# Patient Record
Sex: Female | Born: 2000
Health system: Southern US, Community
[De-identification: ages and names within clinical notes are randomized; demographics above are authoritative.]

## PROBLEM LIST (undated history)

## (undated) DIAGNOSIS — Z9189 Other specified personal risk factors, not elsewhere classified: Secondary | ICD-10-CM

## (undated) DIAGNOSIS — R519 Headache, unspecified: Secondary | ICD-10-CM

## (undated) HISTORY — DX: Other specified personal risk factors, not elsewhere classified: Z91.89

## (undated) HISTORY — PX: WISDOM TOOTH EXTRACTION: SHX21

## (undated) HISTORY — DX: Headache, unspecified: R51.9

---
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
## 2019-07-24 DIAGNOSIS — L988 Other specified disorders of the skin and subcutaneous tissue: Secondary | ICD-10-CM | POA: Diagnosis not present

## 2019-07-24 DIAGNOSIS — S61012A Laceration without foreign body of left thumb without damage to nail, initial encounter: Secondary | ICD-10-CM | POA: Diagnosis not present

## 2019-11-19 DIAGNOSIS — Z20828 Contact with and (suspected) exposure to other viral communicable diseases: Secondary | ICD-10-CM | POA: Diagnosis not present

## 2020-02-18 NOTE — Progress Notes (Addendum)
[facility] [address], [address] [place], [place] [address] [phone]  Date:  02/20/2020   Name:  Maria Acosta   DOB:  10/14/2000   MRN:  [id number]  PCP:  Pearline Cables, MD    Chief Complaint: New Patient (Initial Visit) (Elevated pulse rate, near syncope, possibly /stress related)   History of Present Illness:  Maria Acosta is a 19 y.o. 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  She passed out while walking to class at school one day- was attended by EMTs, seen at ER and sent home .  She passed out also a couple of years ago after observing an operation as part of her pre-vet training She has not had true syncope since that time.  She has had a few pre-syncopal episodes with stress; social stress or a test can do this.  Can also occur when at rest  She notes that during high school she would sometimes do a "boot camp" type workout, this could be associate with presyncopal symptoms but never syncope She is still active in soccer and has not experienced presyncopal symptoms while exercising recently  Pt notes that a relative had a similar issue 2 years ago- they were not sure if this was just dehydration Her PGM had a pacemaker placed   Pt has not seen cardiology as of yet  She is on OCP She will take magnesium supplement some of the time   Flu vaccine today  She has not yet had her COVID-19 vaccine.  I encouraged her to do this as soon as possible  Patient denies any current chance of pregnancy.  She declines STI screening today Other immunizations are up-to-date  There are no problems to display for this  patient.   Past Medical History:  Diagnosis Date  . Frequent headaches   . History of fainting spells of unknown cause     History reviewed. No pertinent surgical history.  Social History   Tobacco Use  . Smoking status: Never Smoker  . Smokeless tobacco: Never Used  Substance Use Topics  . Alcohol use: Not Currently  . Drug use: Never    History reviewed. No pertinent family history.  Not on File  Medication list has been reviewed and updated.  Current Outpatient Medications on File Prior to Visit  Medication Sig Dispense Refill  . Magnesium 250 MG TABS Take 250 mg by mouth daily.    . SRONYX 0.1-20 MG-MCG tablet Take 1 tablet by mouth daily.     No current facility-administered medications on file prior to visit.    Review of Systems:  As per HPI- otherwise negative.   Physical Examination: Vitals:   02/20/20 0939  BP: 126/78  Pulse: (!) 106  Resp: 18  SpO2: 98%   Vitals:   02/20/20 0939  Weight: 191 lb (86.6 kg)  Height: 5' 6.5" (1.689 m)   Body mass index is 30.37 kg/m. Ideal Body Weight: Weight in (lb) to have BMI = 25: 156.9  GEN: no acute distress.  Overweight, otherwise looks well HEENT: Atraumatic, Normocephalic.   Bilateral TM wnl, oropharynx normal.  PEERL,EOMI.   Ears and  Nose: No external deformity. CV: RRR, No M/G/R. No JVD. No thrill. No extra heart sounds. PULM: CTA B, no wheezes, crackles, rhonchi. No retractions. No resp. distress. No accessory muscle use. ABD: S, NT, ND, +BS. No rebound. No HSM. EXTR: No c/c/e PSYCH: Normally interactive. Conversant.   EKG: rate 88, normal EKG for age Assessment and Plan: Pre-syncope - Plan: CBC, TSH, EKG 12-Lead, Ambulatory referral to Cardiology  Screening, deficiency anemia, iron - Plan: CBC  Screening for diabetes mellitus - Plan: Comprehensive metabolic panel, Hemoglobin A1c  Screening for hyperlipidemia - Plan: Lipid panel  Needs flu shot - Plan: Flu Vaccine QUAD 6+ mos PF IM  (Fluarix Quad PF)   Pt here today to establish care Flu shot given Strongly encouraged COVID-19 vaccination as soon as possible Routine labs are pending as above Main issue for this patient is presyncopal and syncopal episode which have occurred for about 6 years She has not yet seen cardiology Referral made to cardiology, hopefully this can be accomplished while she is home for holiday break.  I can certainly go ahead and order an echo and Zio patch if her appointment will be delayed-patient will keep me posted She will let me know if any change or worsening of her symptoms in the meantime   This visit occurred during the SARS-CoV-2 public health emergency.  Safety protocols were in place, including screening questions prior to the visit, additional usage of staff PPE, and extensive cleaning of exam room while observing appropriate contact time as indicated for disinfecting solutions.    Signed Abbe Amsterdam, MD  Results for orders placed or performed in visit on 02/20/20  CBC  Result Value Ref Range   WBC 7.4 4.5 - 10.5 K/uL   RBC 5.02 3.87 - 5.11 Mil/uL   Platelets 267.0 150.0 - 400.0 K/uL   Hemoglobin 14.7 12.0 - 15.0 g/dL   HCT 96.7 59.1 - 63.8 %   MCV 85.0 78.0 - 100.0 fl   MCHC 34.5 30.0 - 36.0 g/dL   RDW 46.6 59.9 - 35.7 %  Comprehensive metabolic panel  Result Value Ref Range   Sodium 138 135 - 145 mEq/L   Potassium 4.8 3.5 - 5.1 mEq/L   Chloride 105 96 - 112 mEq/L   CO2 26 19 - 32 mEq/L   Glucose, Bld 85 70 - 99 mg/dL   BUN 13 6 - 23 mg/dL   Creatinine, Ser 0.17 0.40 - 1.20 mg/dL   Total Bilirubin 0.5 0.2 - 1.2 mg/dL   Alkaline Phosphatase 47 39 - 117 U/L   AST 29 0 - 37 U/L   ALT 21 0 - 35 U/L   Total Protein 7.8 6.0 - 8.3 g/dL   Albumin 4.7 3.5 - 5.2 g/dL   GFR 793.90 >30.09 mL/min   Calcium 9.8 8.4 - 10.5 mg/dL  Hemoglobin Q3R  Result Value Ref Range   Hgb A1c MFr Bld 4.9 4.6 - 6.5 %  TSH  Result Value Ref Range   TSH 1.90 0.35 - 5.50 uIU/mL  Lipid  panel  Result Value Ref Range   Cholesterol 199 0 - 200 mg/dL   Triglycerides 00.7 0.0 - 149.0 mg/dL   HDL 62.26 >33.35 mg/dL   VLDL 45.6 0.0 - 25.6 mg/dL   LDL Cholesterol 389 (H) 0 - 99 mg/dL   Total CHOL/HDL Ratio 4    NonHDL 146.21

## 2020-02-20 ENCOUNTER — Encounter: Payer: Self-pay | Admitting: Family Medicine

## 2020-02-20 ENCOUNTER — Ambulatory Visit (INDEPENDENT_AMBULATORY_CARE_PROVIDER_SITE_OTHER): Payer: BC Managed Care – PPO | Admitting: Family Medicine

## 2020-02-20 ENCOUNTER — Other Ambulatory Visit: Payer: Self-pay

## 2020-02-20 VITALS — BP 126/78 | HR 106 | Resp 18 | Ht 66.5 in | Wt 191.0 lb

## 2020-02-20 DIAGNOSIS — R55 Syncope and collapse: Secondary | ICD-10-CM

## 2020-02-20 DIAGNOSIS — Z130 Encounter for screening for diseases of the blood and blood-forming organs and certain disorders involving the immune mechanism: Secondary | ICD-10-CM | POA: Diagnosis not present

## 2020-02-20 DIAGNOSIS — Z23 Encounter for immunization: Secondary | ICD-10-CM

## 2020-02-20 DIAGNOSIS — Z131 Encounter for screening for diabetes mellitus: Secondary | ICD-10-CM | POA: Diagnosis not present

## 2020-02-20 DIAGNOSIS — Z13220 Encounter for screening for lipoid disorders: Secondary | ICD-10-CM | POA: Diagnosis not present

## 2020-02-20 LAB — CBC
HCT: 42.7 % (ref 36.0–46.0)
Hemoglobin: 14.7 g/dL (ref 12.0–15.0)
MCHC: 34.5 g/dL (ref 30.0–36.0)
MCV: 85 fl (ref 78.0–100.0)
Platelets: 267 10*3/uL (ref 150.0–400.0)
RBC: 5.02 Mil/uL (ref 3.87–5.11)
RDW: 12.3 % (ref 11.5–14.6)
WBC: 7.4 10*3/uL (ref 4.5–10.5)

## 2020-02-20 LAB — COMPREHENSIVE METABOLIC PANEL
ALT: 21 U/L (ref 0–35)
AST: 29 U/L (ref 0–37)
Albumin: 4.7 g/dL (ref 3.5–5.2)
Alkaline Phosphatase: 47 U/L (ref 39–117)
BUN: 13 mg/dL (ref 6–23)
CO2: 26 mEq/L (ref 19–32)
Calcium: 9.8 mg/dL (ref 8.4–10.5)
Chloride: 105 mEq/L (ref 96–112)
Creatinine, Ser: 0.82 mg/dL (ref 0.40–1.20)
GFR: 102.77 mL/min (ref >60.00)
Glucose, Bld: 85 mg/dL (ref 70–99)
Potassium: 4.8 mEq/L (ref 3.5–5.1)
Sodium: 138 mEq/L (ref 135–145)
Total Bilirubin: 0.5 mg/dL (ref 0.2–1.2)
Total Protein: 7.8 g/dL (ref 6.0–8.3)

## 2020-02-20 LAB — TSH: TSH: 1.9 u[IU]/mL (ref 0.35–5.50)

## 2020-02-20 LAB — LIPID PANEL
Cholesterol: 199 mg/dL (ref 0–200)
HDL: 52.7 mg/dL (ref >39.00)
LDL Cholesterol: 128 mg/dL — ABNORMAL HIGH (ref 0–99)
NonHDL: 146.21
Total CHOL/HDL Ratio: 4
Triglycerides: 91 mg/dL (ref 0.0–149.0)
VLDL: 18.2 mg/dL (ref 0.0–40.0)

## 2020-02-20 LAB — HEMOGLOBIN A1C: Hgb A1c MFr Bld: 4.9 % (ref 4.6–6.5)

## 2020-02-20 NOTE — Patient Instructions (Signed)
It was nice to meet you today- I will get you set up with cardiology for further evaluation of your syncope/ presyncope If your appt is not for a week or longer let me know and I will go ahead with setting up an echo and Zio patch monitor for you We will be in touch with your labs asap Please get your covid vaccine asap!!  Flu shot given today No need to wait before getting your covid shot

## 2020-02-27 ENCOUNTER — Other Ambulatory Visit: Payer: Self-pay

## 2020-02-27 DIAGNOSIS — Z9189 Other specified personal risk factors, not elsewhere classified: Secondary | ICD-10-CM | POA: Insufficient documentation

## 2020-02-27 DIAGNOSIS — R519 Headache, unspecified: Secondary | ICD-10-CM | POA: Insufficient documentation

## 2020-03-02 ENCOUNTER — Other Ambulatory Visit: Payer: Self-pay

## 2020-03-02 ENCOUNTER — Ambulatory Visit (INDEPENDENT_AMBULATORY_CARE_PROVIDER_SITE_OTHER): Payer: BC Managed Care – PPO

## 2020-03-02 ENCOUNTER — Encounter: Payer: Self-pay | Admitting: Cardiology

## 2020-03-02 ENCOUNTER — Ambulatory Visit (INDEPENDENT_AMBULATORY_CARE_PROVIDER_SITE_OTHER): Payer: BC Managed Care – PPO | Admitting: Cardiology

## 2020-03-02 VITALS — BP 126/80 | HR 90 | Ht 66.0 in | Wt 198.0 lb

## 2020-03-02 DIAGNOSIS — R002 Palpitations: Secondary | ICD-10-CM

## 2020-03-02 DIAGNOSIS — R55 Syncope and collapse: Secondary | ICD-10-CM | POA: Diagnosis not present

## 2020-03-02 DIAGNOSIS — E669 Obesity, unspecified: Secondary | ICD-10-CM | POA: Diagnosis not present

## 2020-03-02 NOTE — Progress Notes (Signed)
Cardiology Office Note:    Date:  03/02/2020   ID:  Maria Acosta, DOB 10-30-2000, MRN [id number]  PCP:  Maria Cables, MD  Cardiologist:  Maria Ripple, DO  Electrophysiologist:  None   Referring MD: Maria Cables, MD   " I am having palpitation and syncope"   History of Present Illness:    Maria Acosta is a 18 y.o. female with a hx of intermittent headaches comes today to be evaluated for palpitations and syncope episodes.  The patient tells me that it started when she was in high school where she had intermittent episodes of syncope.  It wears off where it was just near syncope episodes.  But recently she noticed that since starting college she is now experiencing worsening palpitations and intermittent syncope events.  She states that the more frequent symptoms she started to experience made her see her PCP.  And at this time she has been asked to see cardiology. She has not had any chest pain no shortness of breath.  Past Medical History:  Diagnosis Date  . Frequent headaches   . History of fainting spells of unknown cause     History reviewed. No pertinent surgical history.  Current Medications: Current Meds  Medication Sig  . Magnesium 250 MG TABS Take 250 mg by mouth daily.  . Multiple Vitamin (MULTIVITAMIN ADULT PO) Take by mouth.  . SRONYX 0.1-20 MG-MCG tablet Take 1 tablet by mouth daily.     Allergies:   Patient has no known allergies.   Social History   Socioeconomic History  . Marital status: Single    Spouse name: Not on file  . Number of children: Not on file  . Years of education: Not on file  . Highest education level: Not on file  Occupational History  . Not on file  Tobacco Use  . Smoking status: Never Smoker  . Smokeless tobacco: Never Used  Substance and Sexual Activity  . Alcohol use: Not Currently  . Drug use: Never  . Sexual activity: Not on file  Other Topics Concern  . Not on file  Social History Narrative  . Not on file    Social Determinants of Health   Financial Resource Strain: Not on file  Food Insecurity: Not on file  Transportation Needs: Not on file  Physical Activity: Not on file  Stress: Not on file  Social Connections: Not on file     Family History: The patient's family history includes Depression in her paternal grandfather; Heart attack in her paternal grandmother; Hyperlipidemia in her father, maternal grandfather, maternal grandmother, paternal grandfather, and paternal grandmother; Hypertension in her father, maternal grandfather, maternal grandmother, paternal grandfather, and paternal grandmother; Leukemia in her paternal grandmother.  ROS:   Review of Systems  Constitution: Negative for decreased appetite, fever and weight gain.  HENT: Negative for congestion, ear discharge, hoarse voice and sore throat.   Eyes: Negative for discharge, redness, vision loss in right eye and visual halos.  Cardiovascular: Negative for chest pain, dyspnea on exertion, leg swelling, orthopnea and palpitations.  Respiratory: Negative for cough, hemoptysis, shortness of breath and snoring.   Endocrine: Negative for heat intolerance and polyphagia.  Hematologic/Lymphatic: Negative for bleeding problem. Does not bruise/bleed easily.  Skin: Negative for flushing, nail changes, rash and suspicious lesions.  Musculoskeletal: Negative for arthritis, joint pain, muscle cramps, myalgias, neck pain and stiffness.  Gastrointestinal: Negative for abdominal pain, bowel incontinence, diarrhea and excessive appetite.  Genitourinary: Negative for decreased libido,  genital sores and incomplete emptying.  Neurological: Negative for brief paralysis, focal weakness, headaches and loss of balance.  Psychiatric/Behavioral: Negative for altered mental status, depression and suicidal ideas.  Allergic/Immunologic: Negative for HIV exposure and persistent infections.    EKGs/Labs/Other Studies Reviewed:    The following  studies were reviewed today:   EKG:  The ekg ordered today demonstrates sinus rhythm, heart rate 90 bpm.  Recent Labs: 02/20/2020: ALT 21; BUN 13; Creatinine, Ser 0.82; Hemoglobin 14.7; Platelets 267.0; Potassium 4.8; Sodium 138; TSH 1.90  Recent Lipid Panel    Component Value Date/Time   CHOL 199 02/20/2020 1022   TRIG 91.0 02/20/2020 1022   HDL 52.70 02/20/2020 1022   CHOLHDL 4 02/20/2020 1022   VLDL 18.2 02/20/2020 1022   LDLCALC 128 (H) 02/20/2020 1022    Physical Exam:    VS:  BP 126/80 (BP Location: Right Arm)   Pulse 90   Ht 5\' 6"  (1.676 m)   Wt 198 lb (89.8 kg)   LMP 02/10/2020 (Within Days)   SpO2 98%   BMI 31.96 kg/m     Wt Readings from Last 3 Encounters:  03/02/20 198 lb (89.8 kg)  02/20/20 191 lb (86.6 kg)     GEN: Well nourished, well developed in no acute distress HEENT: Normal NECK: No JVD; No carotid bruits LYMPHATICS: No lymphadenopathy CARDIAC: S1S2 noted,RRR, no murmurs, rubs, gallops RESPIRATORY:  Clear to auscultation without rales, wheezing or rhonchi  ABDOMEN: Soft, non-tender, non-distended, +bowel sounds, no guarding. EXTREMITIES: No edema, No cyanosis, no clubbing MUSCULOSKELETAL:  No deformity  SKIN: Warm and dry NEUROLOGIC:  Alert and oriented x 3, non-focal PSYCHIATRIC:  Normal affect, good insight  ASSESSMENT:    1. Syncope and collapse   2. Palpitations   3. Obesity (BMI 30-39.9)    PLAN:     I would like to rule out a cardiovascular etiology of this syncope, therefore at this time I would like to placed a zio patch for   14 days. In additon a transthoracic echocardiogram will be ordered to assess LV/RV function and any structural abnormalities. Once these testing have been performed amd reviewed further reccomendations will be made. For now, I do reccomend that the patient goes to the nearest ED if  symptoms recur.  The patient understands the need to lose weight with diet and exercise. We have discussed specific strategies for  this.  The patient is in agreement with the above plan. The patient left the office in stable condition.  The patient will follow up in 3 months or sooner if needed.   Medication Adjustments/Labs and Tests Ordered: Current medicines are reviewed at length with the patient today.  Concerns regarding medicines are outlined above.  No orders of the defined types were placed in this encounter.  No orders of the defined types were placed in this encounter.   There are no Patient Instructions on file for this visit.   Adopting a Healthy Lifestyle.  Know what a healthy weight is for you (roughly BMI <25) and aim to maintain this   Aim for 7+ servings of fruits and vegetables daily   65-80+ fluid ounces of water or unsweet tea for healthy kidneys   Limit to max 1 drink of alcohol per day; avoid smoking/tobacco   Limit animal fats in diet for cholesterol and heart health - choose grass fed whenever available   Avoid highly processed foods, and foods high in saturated/trans fats   Aim for low stress - take  time to unwind and care for your mental health   Aim for 150 min of moderate intensity exercise weekly for heart health, and weights twice weekly for bone health   Aim for 7-9 hours of sleep daily   When it comes to diets, agreement about the perfect plan isnt easy to find, even among the experts. Experts at the [facility] [employer] developed an idea known as the Healthy Eating Plate. Just imagine a plate divided into logical, healthy portions.   The emphasis is on diet quality:   Load up on vegetables and fruits - one-half of your plate: Aim for color and variety, and remember that potatoes dont count.   Go for whole grains - one-quarter of your plate: Whole wheat, barley, wheat berries, quinoa, oats, brown rice, and foods made with them. If you want pasta, go with whole wheat pasta.   Protein power - one-quarter of your plate: Fish, chicken, beans, and nuts are all  healthy, versatile protein sources. Limit red meat.   The diet, however, does go beyond the plate, offering a few other suggestions.   Use healthy plant oils, such as olive, canola, soy, corn, sunflower and peanut. Check the labels, and avoid partially hydrogenated oil, which have unhealthy trans fats.   If youre thirsty, drink water. Coffee and tea are good in moderation, but skip sugary drinks and limit milk and dairy products to one or two daily servings.   The type of carbohydrate in the diet is more important than the amount. Some sources of carbohydrates, such as vegetables, fruits, whole grains, and beans-are healthier than others.   Finally, stay active  Signed, Maria Ripple, DO  03/02/2020 10:07 AM    [facility]

## 2020-03-02 NOTE — Patient Instructions (Signed)
Medication Instructions:  No medication changes. *If you need a refill on your cardiac medications before your next appointment, please call your pharmacy*   Lab Work: None ordered If you have labs (blood work) drawn today and your tests are completely normal, you will receive your results only by: Marland Kitchen MyChart Message (if you have MyChart) OR . A paper copy in the mail If you have any lab test that is abnormal or we need to change your treatment, we will call you to review the results.   Testing/Procedures: Your physician has requested that you have an echocardiogram. Echocardiography is a painless test that uses sound waves to create images of your heart. It provides your doctor with information about the size and shape of your heart and how well your heart's chambers and valves are working. This procedure takes approximately one hour. There are no restrictions for this procedure.   WHY IS MY DOCTOR PRESCRIBING ZIO? The Zio system is proven and trusted by physicians to detect and diagnose irregular heart rhythms -- and has been prescribed to hundreds of thousands of patients.  The FDA has cleared the Zio system to monitor for many different kinds of irregular heart rhythms. In a study, physicians were able to reach a diagnosis 90% of the time with the Zio system1.  You can wear the Zio monitor -- a small, discreet, comfortable patch -- during your normal day-to-day activity, including while you sleep, shower, and exercise, while it records every single heartbeat for analysis.  1Barrett, P., et al. Comparison of 24 Hour Holter Monitoring Versus 14 Day Novel Adhesive Patch Electrocardiographic Monitoring. American Journal of Medicine, 2014.  ZIO VS. HOLTER MONITORING The Zio monitor can be comfortably worn for up to 14 days. Holter monitors can be worn for 24 to 48 hours, limiting the time to record any irregular heart rhythms you may have. Zio is able to capture data for the 51% of patients  who have their first symptom-triggered arrhythmia after 48 hours.1  LIVE WITHOUT RESTRICTIONS The Zio ambulatory cardiac monitor is a small, unobtrusive, and water-resistant patch--you might even forget you're wearing it. The Zio monitor records and stores every beat of your heart, whether you're sleeping, working out, or showering. Wear the monitor for 14 days, remove 03/16/2020.  Follow-Up: At [facility], you and your health needs are our priority.  As part of our continuing mission to provide you with exceptional heart care, we have created designated Provider Care Teams.  These Care Teams include your primary Cardiologist (physician) and Advanced Practice Providers (APPs -  Physician Assistants and Nurse Practitioners) who all work together to provide you with the care you need, when you need it.  We recommend signing up for the patient portal called "MyChart".  Sign up information is provided on this After Visit Summary.  MyChart is used to connect with patients for Virtual Visits (Telemedicine).  Patients are able to view lab/test results, encounter notes, upcoming appointments, etc.  Non-urgent messages can be sent to your provider as well.   To learn more about what you can do with MyChart, go to [email or web address].    Your next appointment:   3 month(s)  The format for your next appointment:   In Person  Provider:   Thomasene Ripple, DO   Other Instructions  Echocardiogram An echocardiogram is a procedure that uses painless sound waves (ultrasound) to produce an image of the heart. Images from an echocardiogram can provide important information about:  Signs of coronary artery  disease (CAD).  Aneurysm detection. An aneurysm is a weak or damaged part of an artery wall that bulges out from the normal force of blood pumping through the body.  Heart size and shape. Changes in the size or shape of the heart can be associated with certain conditions, including heart failure,  aneurysm, and CAD.  Heart muscle function.  Heart valve function.  Signs of a past heart attack.  Fluid buildup around the heart.  Thickening of the heart muscle.  A tumor or infectious growth around the heart valves. Tell a health care provider about:  Any allergies you have.  All medicines you are taking, including vitamins, herbs, eye drops, creams, and over-the-counter medicines.  Any blood disorders you have.  Any surgeries you have had.  Any medical conditions you have.  Whether you are pregnant or may be pregnant. What are the risks? Generally, this is a safe procedure. However, problems may occur, including:  Allergic reaction to dye (contrast) that may be used during the procedure. What happens before the procedure? No specific preparation is needed. You may eat and drink normally. What happens during the procedure?    An IV tube may be inserted into one of your veins.  You may receive contrast through this tube. A contrast is an injection that improves the quality of the pictures from your heart.  A gel will be applied to your chest.  A wand-like tool (transducer) will be moved over your chest. The gel will help to transmit the sound waves from the transducer.  The sound waves will harmlessly bounce off of your heart to allow the heart images to be captured in real-time motion. The images will be recorded on a computer. The procedure may vary among health care providers and hospitals. What happens after the procedure?  You may return to your normal, everyday life, including diet, activities, and medicines, unless your health care provider tells you not to do that. Summary  An echocardiogram is a procedure that uses painless sound waves (ultrasound) to produce an image of the heart.  Images from an echocardiogram can provide important information about the size and shape of your heart, heart muscle function, heart valve function, and fluid buildup around  your heart.  You do not need to do anything to prepare before this procedure. You may eat and drink normally.  After the echocardiogram is completed, you may return to your normal, everyday life, unless your health care provider tells you not to do that. This information is not intended to replace advice given to you by your health care provider. Make sure you discuss any questions you have with your health care provider. Document Revised: 06/21/2018 Document Reviewed: 04/02/2016 Elsevier Patient Education  [employer].

## 2020-03-26 ENCOUNTER — Encounter: Payer: Self-pay | Admitting: Family Medicine

## 2020-03-27 ENCOUNTER — Telehealth: Payer: Self-pay

## 2020-03-27 MED ORDER — PROPRANOLOL HCL 20 MG PO TABS: 20.0000 mg | ORAL_TABLET | Freq: Two times a day (BID) | ORAL | 3 refills | Status: DC

## 2020-03-27 NOTE — Telephone Encounter (Signed)
Left message on patients voicemail to please return our call.   

## 2020-03-27 NOTE — Telephone Encounter (Signed)
-----   Message from Thomasene Ripple, DO sent at 03/26/2020 10:23 PM EST ----- Monitor normal

## 2020-03-30 ENCOUNTER — Other Ambulatory Visit (HOSPITAL_BASED_OUTPATIENT_CLINIC_OR_DEPARTMENT_OTHER): Payer: BC Managed Care – PPO

## 2020-04-03 ENCOUNTER — Ambulatory Visit (HOSPITAL_BASED_OUTPATIENT_CLINIC_OR_DEPARTMENT_OTHER): Payer: BC Managed Care – PPO

## 2020-05-21 DIAGNOSIS — R051 Acute cough: Secondary | ICD-10-CM | POA: Diagnosis not present

## 2020-05-21 DIAGNOSIS — R070 Pain in throat: Secondary | ICD-10-CM | POA: Diagnosis not present

## 2020-06-01 ENCOUNTER — Ambulatory Visit: Payer: BC Managed Care – PPO | Admitting: Cardiology

## 2020-06-05 ENCOUNTER — Ambulatory Visit (INDEPENDENT_AMBULATORY_CARE_PROVIDER_SITE_OTHER): Payer: BC Managed Care – PPO | Admitting: Cardiology

## 2020-06-05 ENCOUNTER — Other Ambulatory Visit: Payer: Self-pay

## 2020-06-05 ENCOUNTER — Encounter: Payer: Self-pay | Admitting: Cardiology

## 2020-06-05 VITALS — BP 134/78 | HR 66 | Ht 66.0 in | Wt 194.1 lb

## 2020-06-05 DIAGNOSIS — R002 Palpitations: Secondary | ICD-10-CM

## 2020-06-05 MED ORDER — PROPRANOLOL HCL 20 MG PO TABS
20.0000 mg | ORAL_TABLET | Freq: Two times a day (BID) | ORAL | 3 refills | Status: DC
Start: 2020-06-05 — End: 2021-06-21

## 2020-06-05 NOTE — Patient Instructions (Signed)

## 2020-06-05 NOTE — Progress Notes (Signed)
Cardiology Office Note:    Date:  06/05/2020   ID:  Maria Acosta, DOB December 31, 2000, MRN [id number]  PCP:  Pearline Cables, MD  Cardiologist:  Thomasene Ripple, DO  Electrophysiologist:  None   Referring MD: Pearline Cables, MD   Feel a lot better  History of Present Illness:    Maria Acosta is a 20 y.o. female with a hx of intermittent headache who came to be evaluated for palpitations syncope.  Placed a monitor on the patient this was normal.  An echocardiogram was ordered but the patient had defer not to get this done for now.  In the interim since her last visit she has had significant palpitations we have since started her on propanolol 1 mg twice a day.  She is very happy with this she tells me that he is doing a lot better.  No other recurrent symptoms.  Past Medical History:  Diagnosis Date  . Frequent headaches   . History of fainting spells of unknown cause     Past Surgical History:  Procedure Laterality Date  . WISDOM TOOTH EXTRACTION      Current Medications: Current Meds  Medication Sig  . Magnesium 250 MG TABS Take 250 mg by mouth daily.  . Multiple Vitamin (MULTIVITAMIN ADULT PO) Take by mouth.  . SRONYX 0.1-20 MG-MCG tablet Take 1 tablet by mouth daily.  . [DISCONTINUED] propranolol (INDERAL) 20 MG tablet Take 1 tablet (20 mg total) by mouth 2 (two) times daily.     Allergies:   Patient has no known allergies.   Social History   Socioeconomic History  . Marital status: Single    Spouse name: Not on file  . Number of children: Not on file  . Years of education: Not on file  . Highest education level: Not on file  Occupational History  . Not on file  Tobacco Use  . Smoking status: Never Smoker  . Smokeless tobacco: Never Used  Substance and Sexual Activity  . Alcohol use: Not Currently  . Drug use: Never  . Sexual activity: Not on file  Other Topics Concern  . Not on file  Social History Narrative  . Not on file   Social Determinants of  Health   Financial Resource Strain: Not on file  Food Insecurity: Not on file  Transportation Needs: Not on file  Physical Activity: Not on file  Stress: Not on file  Social Connections: Not on file     Family History: The patient's family history includes Depression in her paternal grandfather; Heart attack in her paternal grandmother; Hyperlipidemia in her father, maternal grandfather, maternal grandmother, paternal grandfather, and paternal grandmother; Hypertension in her father, maternal grandfather, maternal grandmother, paternal grandfather, and paternal grandmother; Leukemia in her paternal grandmother.  ROS:   Review of Systems  Constitution: Negative for decreased appetite, fever and weight gain.  HENT: Negative for congestion, ear discharge, hoarse voice and sore throat.   Eyes: Negative for discharge, redness, vision loss in right eye and visual halos.  Cardiovascular: Negative for chest pain, dyspnea on exertion, leg swelling, orthopnea and palpitations.  Respiratory: Negative for cough, hemoptysis, shortness of breath and snoring.   Endocrine: Negative for heat intolerance and polyphagia.  Hematologic/Lymphatic: Negative for bleeding problem. Does not bruise/bleed easily.  Skin: Negative for flushing, nail changes, rash and suspicious lesions.  Musculoskeletal: Negative for arthritis, joint pain, muscle cramps, myalgias, neck pain and stiffness.  Gastrointestinal: Negative for abdominal pain, bowel incontinence, diarrhea and excessive  appetite.  Genitourinary: Negative for decreased libido, genital sores and incomplete emptying.  Neurological: Negative for brief paralysis, focal weakness, headaches and loss of balance.  Psychiatric/Behavioral: Negative for altered mental status, depression and suicidal ideas.  Allergic/Immunologic: Negative for HIV exposure and persistent infections.    EKGs/Labs/Other Studies Reviewed:    The following studies were reviewed  today:   EKG:  None today  Zio monitor  The patient wore the monitor for 14 days 0 hours starting March 10, 2020. Indication: Palpitations  The minimum heart rate was 47 bpm, maximum heart rate was 197 bpm, and average heart rate was 81 bpm. Predominant underlying rhythm was Sinus Rhythm.   Premature atrial complexes were rare less than 1%. Premature Ventricular complexes were rare less than 1%.  No ventricular tachycardia, no pauses, No AV block, no supraventricular tachycardia and no atrial fibrillation present. 2 patient triggered events and 2 diary events all associated with sinus rhythm and sinus tachycardia.    Conclusion: Normal/unremarkable study with no significant arrhythmia.   Recent Labs: 02/20/2020: ALT 21; BUN 13; Creatinine, Ser 0.82; Hemoglobin 14.7; Platelets 267.0; Potassium 4.8; Sodium 138; TSH 1.90  Recent Lipid Panel    Component Value Date/Time   CHOL 199 02/20/2020 1022   TRIG 91.0 02/20/2020 1022   HDL 52.70 02/20/2020 1022   CHOLHDL 4 02/20/2020 1022   VLDL 18.2 02/20/2020 1022   LDLCALC 128 (H) 02/20/2020 1022    Physical Exam:    VS:  BP 134/78   Pulse 66   Ht 5\' 6"  (1.676 m)   Wt 194 lb 1.9 oz (88.1 kg)   SpO2 98%   BMI 31.33 kg/m     Wt Readings from Last 3 Encounters:  06/05/20 194 lb 1.9 oz (88.1 kg)  03/02/20 198 lb (89.8 kg)  02/20/20 191 lb (86.6 kg)     GEN: Well nourished, well developed in no acute distress HEENT: Normal NECK: No JVD; No carotid bruits LYMPHATICS: No lymphadenopathy CARDIAC: S1S2 noted,RRR, no murmurs, rubs, gallops RESPIRATORY:  Clear to auscultation without rales, wheezing or rhonchi  ABDOMEN: Soft, non-tender, non-distended, +bowel sounds, no guarding. EXTREMITIES: No edema, No cyanosis, no clubbing MUSCULOSKELETAL:  No deformity  SKIN: Warm and dry NEUROLOGIC:  Alert and oriented x 3, non-focal PSYCHIATRIC:  Normal affect, good insight  ASSESSMENT:    1. Palpitations    PLAN:      1.  She will continue her current dose of propanolol 20 mg twice daily.  We will send refills.  The patient will see me annually or as needed.  Of note her monitor was normal.  Therefore symptoms may be stress/anxiety related.  The patient is in agreement with the above plan. The patient left the office in stable condition.  The patient will follow up in 1 year   Medication Adjustments/Labs and Tests Ordered: Current medicines are reviewed at length with the patient today.  Concerns regarding medicines are outlined above.  No orders of the defined types were placed in this encounter.  Meds ordered this encounter  Medications  . propranolol (INDERAL) 20 MG tablet    Sig: Take 1 tablet (20 mg total) by mouth 2 (two) times daily.    Dispense:  180 tablet    Refill:  3    Patient Instructions  Medication Instructions:  Your physician recommends that you continue on your current medications as directed. Please refer to the Current Medication list given to you today.  *If you need a refill on your  cardiac medications before your next appointment, please call your pharmacy*   Lab Work: None If you have labs (blood work) drawn today and your tests are completely normal, you will receive your results only by: Marland Kitchen MyChart Message (if you have MyChart) OR . A paper copy in the mail If you have any lab test that is abnormal or we need to change your treatment, we will call you to review the results.   Testing/Procedures: None   Follow-Up: At [facility], you and your health needs are our priority.  As part of our continuing mission to provide you with exceptional heart care, we have created designated Provider Care Teams.  These Care Teams include your primary Cardiologist (physician) and Advanced Practice Providers (APPs -  Physician Assistants and Nurse Practitioners) who all work together to provide you with the care you need, when you need it.  We recommend signing up for the  patient portal called "MyChart".  Sign up information is provided on this After Visit Summary.  MyChart is used to connect with patients for Virtual Visits (Telemedicine).  Patients are able to view lab/test results, encounter notes, upcoming appointments, etc.  Non-urgent messages can be sent to your provider as well.   To learn more about what you can do with MyChart, go to [email or web address].    Your next appointment:   As needed  The format for your next appointment:   In Person  Provider:   Thomasene Ripple, DO   Other Instructions      Adopting a Healthy Lifestyle.  Know what a healthy weight is for you (roughly BMI <25) and aim to maintain this   Aim for 7+ servings of fruits and vegetables daily   65-80+ fluid ounces of water or unsweet tea for healthy kidneys   Limit to max 1 drink of alcohol per day; avoid smoking/tobacco   Limit animal fats in diet for cholesterol and heart health - choose grass fed whenever available   Avoid highly processed foods, and foods high in saturated/trans fats   Aim for low stress - take time to unwind and care for your mental health   Aim for 150 min of moderate intensity exercise weekly for heart health, and weights twice weekly for bone health   Aim for 7-9 hours of sleep daily   When it comes to diets, agreement about the perfect plan isnt easy to find, even among the experts. Experts at the [facility] [employer] developed an idea known as the Healthy Eating Plate. Just imagine a plate divided into logical, healthy portions.   The emphasis is on diet quality:   Load up on vegetables and fruits - one-half of your plate: Aim for color and variety, and remember that potatoes dont count.   Go for whole grains - one-quarter of your plate: Whole wheat, barley, wheat berries, quinoa, oats, brown rice, and foods made with them. If you want pasta, go with whole wheat pasta.   Protein power - one-quarter of your plate: Fish,  chicken, beans, and nuts are all healthy, versatile protein sources. Limit red meat.   The diet, however, does go beyond the plate, offering a few other suggestions.   Use healthy plant oils, such as olive, canola, soy, corn, sunflower and peanut. Check the labels, and avoid partially hydrogenated oil, which have unhealthy trans fats.   If youre thirsty, drink water. Coffee and tea are good in moderation, but skip sugary drinks and limit milk and dairy products  to one or two daily servings.   The type of carbohydrate in the diet is more important than the amount. Some sources of carbohydrates, such as vegetables, fruits, whole grains, and beans-are healthier than others.   Finally, stay active  Signed, Thomasene Ripple, DO  06/05/2020 9:55 AM    [facility]

## 2021-03-31 DIAGNOSIS — D239 Other benign neoplasm of skin, unspecified: Secondary | ICD-10-CM | POA: Diagnosis not present

## 2021-03-31 DIAGNOSIS — D229 Melanocytic nevi, unspecified: Secondary | ICD-10-CM | POA: Diagnosis not present

## 2021-03-31 DIAGNOSIS — L858 Other specified epidermal thickening: Secondary | ICD-10-CM | POA: Diagnosis not present

## 2021-06-19 ENCOUNTER — Other Ambulatory Visit: Payer: Self-pay | Admitting: Cardiology

## 2021-07-13 ENCOUNTER — Encounter: Payer: Self-pay | Admitting: Family Medicine

## 2021-07-19 ENCOUNTER — Telehealth: Payer: Self-pay | Admitting: Family Medicine

## 2021-07-19 NOTE — Telephone Encounter (Signed)
I called pt and LMOM- we are glad to see her but we don't carry the rabies vaccine here.  Please see my previous mychart message for some options  ?JC ?

## 2021-07-19 NOTE — Telephone Encounter (Signed)
-----   Message from Pearline Cables, MD sent at 07/19/2021  5:43 AM EDT ----- ?Call this patient, she has an appointment on Wednesday for rabies vaccine which we do not carry ? ?

## 2021-07-21 ENCOUNTER — Ambulatory Visit: Payer: BC Managed Care – PPO | Admitting: Family Medicine

## 2021-09-19 ENCOUNTER — Other Ambulatory Visit: Payer: Self-pay | Admitting: Cardiology

## 2021-09-20 ENCOUNTER — Encounter: Payer: Self-pay | Admitting: Cardiology

## 2021-09-20 ENCOUNTER — Other Ambulatory Visit: Payer: Self-pay | Admitting: Cardiology

## 2021-09-28 DIAGNOSIS — Z23 Encounter for immunization: Secondary | ICD-10-CM | POA: Diagnosis not present

## 2021-10-05 DIAGNOSIS — Z23 Encounter for immunization: Secondary | ICD-10-CM | POA: Diagnosis not present

## 2021-11-07 ENCOUNTER — Encounter: Payer: Self-pay | Admitting: Family Medicine

## 2021-11-08 NOTE — Telephone Encounter (Signed)
Left vm to return call.    

## 2021-11-08 NOTE — Telephone Encounter (Signed)
I Held the 4pm slot incase she calls back

## 2021-12-04 NOTE — Progress Notes (Unsigned)
[facility] [address], [place], [place] [address] [phone]  Date:  12/06/2021   Name:  Maria Acosta   DOB:  Apr 26, 2000   MRN:  [id number]  PCP:  Darreld Mclean, MD    Chief Complaint: No chief complaint on file.   History of Present Illness:  Maria Acosta is a 21 y.o. very pleasant female patient who presents with the following:  Following up today for concern of hemorrhoids Last seen by myself in 2021 History of palpitations and syncope- seen by Dr Harriet Masson in March of 2022  Patient Active Problem List   Diagnosis Date Noted   History of fainting spells of unknown cause    Frequent headaches     Past Medical History:  Diagnosis Date   Frequent headaches    History of fainting spells of unknown cause     Past Surgical History:  Procedure Laterality Date   WISDOM TOOTH EXTRACTION      Social History   Tobacco Use   Smoking status: Never   Smokeless tobacco: Never  Substance Use Topics   Alcohol use: Not Currently   Drug use: Never    Family History  Problem Relation Age of Onset   Hyperlipidemia Father    Hypertension Father    Hyperlipidemia Maternal Grandmother    Hypertension Maternal Grandmother    Hyperlipidemia Maternal Grandfather    Hypertension Maternal Grandfather    Hyperlipidemia Paternal Grandmother    Hypertension Paternal Grandmother    Leukemia Paternal Grandmother    Heart attack Paternal Grandmother    Hyperlipidemia Paternal Grandfather    Hypertension Paternal Grandfather    Depression Paternal Grandfather     No Known Allergies  Medication list has been reviewed and updated.  Current Outpatient Medications on File Prior to Visit  Medication Sig Dispense Refill   Magnesium 250 MG TABS Take 250 mg by mouth daily.     Multiple Vitamin (MULTIVITAMIN ADULT PO) Take by mouth.     propranolol (INDERAL) 20 MG tablet TAKE 1 TABLET BY MOUTH TWICE A DAY 180 tablet  2   SRONYX 0.1-20 MG-MCG tablet Take 1 tablet by mouth daily.     No current facility-administered medications on file prior to visit.    Review of Systems:  As per HPI- otherwise negative.   Physical Examination: There were no vitals filed for this visit. There were no vitals filed for this visit. There is no height or weight on file to calculate BMI. Ideal Body Weight:    GEN: no acute distress. HEENT: Atraumatic, Normocephalic.  Ears and Nose: No external deformity. CV: RRR, No M/G/R. No JVD. No thrill. No extra heart sounds. PULM: CTA B, no wheezes, crackles, rhonchi. No retractions. No resp. distress. No accessory muscle use. ABD: S, NT, ND, +BS. No rebound. No HSM. EXTR: No c/c/e PSYCH: Normally interactive. Conversant.    Assessment and Plan: ***  Signed Lamar Blinks, MD

## 2021-12-06 ENCOUNTER — Ambulatory Visit (INDEPENDENT_AMBULATORY_CARE_PROVIDER_SITE_OTHER): Payer: BC Managed Care – PPO | Admitting: Family Medicine

## 2021-12-06 VITALS — BP 110/70 | HR 60 | Temp 97.7°F | Resp 18 | Ht 66.0 in | Wt 201.6 lb

## 2021-12-06 DIAGNOSIS — K649 Unspecified hemorrhoids: Secondary | ICD-10-CM | POA: Diagnosis not present

## 2021-12-06 MED ORDER — HYDROCORTISONE ACETATE 25 MG RE SUPP: 25.0000 mg | Freq: Two times a day (BID) | RECTAL | 0 refills | Status: AC

## 2021-12-06 NOTE — Patient Instructions (Signed)
It was good to see you today- best of luck with school!   For hemorrhoids- try to keep stool soft with plenty of water and fruits/ veggies/ fiber A stool softener may help Try to limit sitting as best you can Avoid straining to have a BM For flares- Tucks pads, prep H, or the Rx suppository can help  Let me know if you need anything  Be sure to get your flu shot this fall, also recommend the latest covid booster

## 2021-12-13 ENCOUNTER — Ambulatory Visit: Payer: BC Managed Care – PPO | Admitting: Family Medicine

## 2022-05-19 ENCOUNTER — Encounter: Payer: Self-pay | Admitting: Family Medicine

## 2022-05-19 MED ORDER — SRONYX 0.1-20 MG-MCG PO TABS: 1.0000 | ORAL_TABLET | Freq: Every day | ORAL | 3 refills | Status: DC

## 2022-05-19 NOTE — Telephone Encounter (Signed)
Okay for refill?  

## 2022-06-19 ENCOUNTER — Other Ambulatory Visit: Payer: Self-pay | Admitting: Cardiology

## 2023-03-26 ENCOUNTER — Other Ambulatory Visit: Payer: Self-pay | Admitting: Cardiology

## 2023-04-09 ENCOUNTER — Other Ambulatory Visit: Payer: Self-pay | Admitting: Family Medicine

## 2023-04-25 ENCOUNTER — Other Ambulatory Visit: Payer: Self-pay | Admitting: Cardiology

## 2023-04-25 NOTE — Telephone Encounter (Signed)
Rx refill sent to pharmacy.

## 2023-05-09 ENCOUNTER — Other Ambulatory Visit: Payer: Self-pay | Admitting: Cardiology

## 2023-12-22 ENCOUNTER — Encounter: Payer: Self-pay | Admitting: Family Medicine

## 2024-03-29 ENCOUNTER — Encounter: Payer: Self-pay | Admitting: Family Medicine
